# Patient Record
Sex: Female | Born: 1965 | Race: White | Hispanic: No | Smoking: Current every day smoker
Health system: Southern US, Community
[De-identification: ages and names within clinical notes are randomized; demographics above are authoritative.]

## PROBLEM LIST (undated history)

## (undated) DIAGNOSIS — N6452 Nipple discharge: Secondary | ICD-10-CM

## (undated) DIAGNOSIS — I10 Essential (primary) hypertension: Secondary | ICD-10-CM

## (undated) DIAGNOSIS — K219 Gastro-esophageal reflux disease without esophagitis: Secondary | ICD-10-CM

## (undated) DIAGNOSIS — I639 Cerebral infarction, unspecified: Secondary | ICD-10-CM

## (undated) HISTORY — DX: Nipple discharge: N64.52

## (undated) HISTORY — DX: Essential (primary) hypertension: I10

## (undated) HISTORY — PX: CHOLECYSTECTOMY: SHX55

## (undated) HISTORY — DX: Cerebral infarction, unspecified: I63.9

## (undated) HISTORY — DX: Gastro-esophageal reflux disease without esophagitis: K21.9

## (undated) HISTORY — PX: TUBAL LIGATION: SHX77

---

## 2007-08-22 ENCOUNTER — Emergency Department: Payer: Self-pay

## 2008-07-09 ENCOUNTER — Emergency Department: Payer: Self-pay | Admitting: Emergency Medicine

## 2011-02-14 ENCOUNTER — Emergency Department: Payer: Self-pay | Admitting: Emergency Medicine

## 2013-07-05 ENCOUNTER — Ambulatory Visit: Payer: Self-pay

## 2013-07-12 ENCOUNTER — Ambulatory Visit (INDEPENDENT_AMBULATORY_CARE_PROVIDER_SITE_OTHER): Payer: PRIVATE HEALTH INSURANCE | Admitting: General Surgery

## 2013-07-12 ENCOUNTER — Encounter: Payer: Self-pay | Admitting: General Surgery

## 2013-07-12 VITALS — BP 138/78 | HR 94 | Resp 12 | Ht 64.0 in | Wt 213.0 lb

## 2013-07-12 DIAGNOSIS — N6459 Other signs and symptoms in breast: Secondary | ICD-10-CM

## 2013-07-12 DIAGNOSIS — N6452 Nipple discharge: Secondary | ICD-10-CM

## 2013-07-12 HISTORY — DX: Nipple discharge: N64.52

## 2013-07-12 NOTE — Progress Notes (Signed)
Patient ID: Carolyn Murray, female   DOB: 1966-04-04, 47 y.o.   MRN: [id number]  Chief Complaint  Patient presents with  . Other    bilateral breast discharge    HPI Carolyn Murray is a 47 y.o. female who presents for a breast evaluation. The patient states she is having bilateral breast discharge for about two months now and getting worst.She states it is dark green.Patient had an mammogram and ultrasound completed 07/05/13, BI-RAD cat 2. She has never had any breast problems in the past. HPI  Past Medical History  Diagnosis Date  . Stroke   . GERD (gastroesophageal reflux disease)   . Hypertension   . Nipple discharge 07/12/2013    Past Surgical History  Procedure Laterality Date  . Cholecystectomy    . Tubal ligation      Family History  Problem Relation Age of Onset  . Skin cancer Father   . Breast cancer Maternal Grandmother   . Diabetes Mother     pass in 2013    Social History History  Substance Use Topics  . Smoking status: Current Every Day Smoker -- 0.50 packs/day for 20 years    Types: Cigarettes  . Smokeless tobacco: Never Used  . Alcohol Use: Yes    No Known Allergies  Current Outpatient Prescriptions  Medication Sig Dispense Refill  . Aspirin-Salicylamide-Caffeine (BC HEADACHE POWDER PO) Take 1 Package by mouth every 6 (six) hours as needed.      Marland Kitchen ibuprofen (ADVIL,MOTRIN) 200 MG tablet Take 200 mg by mouth every 6 (six) hours as needed for pain.       No current facility-administered medications for this visit.    Review of Systems Review of Systems  Constitutional: Negative.   Respiratory: Negative.   Cardiovascular: Negative.     Blood pressure 138/78, pulse 94, resp. rate 12, height 5\' 4"  (1.626 m), weight 213 lb (96.616 kg).  Physical Exam Physical Exam  Constitutional: She is oriented to person, place, and time. She appears well-developed and well-nourished.  Cardiovascular: Normal rate, regular rhythm and normal heart sounds.    Pulmonary/Chest: Breath sounds normal. Right breast exhibits no inverted nipple, no mass, no nipple discharge, no skin change and no tenderness. Left breast exhibits no inverted nipple, no mass, no nipple discharge, no skin change and no tenderness.  Lymphadenopathy:    She has no cervical adenopathy.    She has no axillary adenopathy.  Neurological: She is alert and oriented to person, place, and time.  Skin: Skin is warm and dry.   No nipple discharge could be elicited on today's exam in spite of vigorous compression.  Data Reviewed Recently completed mammograms and ultrasound were reviewed.  Assessment    Likely ductal inflammation.    Plan   ( Smoking cessation was encouraged. She reports that all nonsteroidals produce drowsiness. She's been asked to make use of 2 Aleve tablets at bedtime for the next month to see if this will alleviate her symptoms.       Earline Mayotte 07/12/2013, 9:30 PM

## 2013-07-12 NOTE — Patient Instructions (Signed)
Patient to take two  Aleve's at night for the next month.

## 2013-08-15 ENCOUNTER — Ambulatory Visit: Payer: PRIVATE HEALTH INSURANCE | Admitting: General Surgery

## 2013-08-30 ENCOUNTER — Other Ambulatory Visit: Payer: PRIVATE HEALTH INSURANCE

## 2013-08-30 ENCOUNTER — Ambulatory Visit (INDEPENDENT_AMBULATORY_CARE_PROVIDER_SITE_OTHER): Payer: PRIVATE HEALTH INSURANCE | Admitting: General Surgery

## 2013-08-30 ENCOUNTER — Encounter: Payer: Self-pay | Admitting: General Surgery

## 2013-08-30 VITALS — BP 164/96 | HR 104 | Resp 14 | Ht 64.0 in | Wt 209.0 lb

## 2013-08-30 DIAGNOSIS — N6452 Nipple discharge: Secondary | ICD-10-CM

## 2013-08-30 DIAGNOSIS — N6459 Other signs and symptoms in breast: Secondary | ICD-10-CM

## 2013-08-30 NOTE — Progress Notes (Signed)
2Patient ID: Carolyn Murray, female   DOB: 01-30-1966, 47 y.o.   MRN: [id number]  Chief Complaint  Patient presents with  . Follow-up    bilateral nipple discharge    HPI Carolyn Murray is a 47 y.o. female.  Here today for one month follow up bilateral nipple discharge.  States discharge is about the same no better and no worse.  States it occurs "own its own" and with warm showers..  Using aleve twice a day but breast area still tender. Overall, the pain does seem to be some better.   HPI  Past Medical History  Diagnosis Date  . Stroke   . GERD (gastroesophageal reflux disease)   . Hypertension   . Nipple discharge 07/12/2013    Past Surgical History  Procedure Laterality Date  . Cholecystectomy    . Tubal ligation      Family History  Problem Relation Age of Onset  . Skin cancer Father   . Breast cancer Maternal Grandmother   . Diabetes Mother     pass in 2013    Social History History  Substance Use Topics  . Smoking status: Current Every Day Smoker -- 0.50 packs/day for 20 years    Types: Cigarettes  . Smokeless tobacco: Never Used  . Alcohol Use: Yes    No Known Allergies  Current Outpatient Prescriptions  Medication Sig Dispense Refill  . Aspirin-Salicylamide-Caffeine (BC HEADACHE POWDER PO) Take 1 Package by mouth every 6 (six) hours as needed.      Marland Kitchen ibuprofen (ADVIL,MOTRIN) 200 MG tablet Take 200 mg by mouth every 6 (six) hours as needed for pain.       No current facility-administered medications for this visit.    Review of Systems Review of Systems  Constitutional: Negative.   Respiratory: Negative.   Cardiovascular: Negative.     Blood pressure 164/96, pulse 104, resp. rate 14, height 5\' 4"  (1.626 m), weight 209 lb (94.802 kg).  Physical Exam Physical Exam  Constitutional: She is oriented to person, place, and time. She appears well-developed and well-nourished.  Cardiovascular: Normal rate and regular rhythm.   Pulmonary/Chest: Effort normal and  breath sounds normal. Right breast exhibits nipple discharge. Right breast exhibits no inverted nipple (three ducts with tan drainage), no mass, no skin change and no tenderness. Left breast exhibits nipple discharge (single duct drainage). Left breast exhibits no inverted nipple, no mass, no skin change and no tenderness.  Lymphadenopathy:    She has no cervical adenopathy.    She has no axillary adenopathy.  Neurological: She is alert and oriented to person, place, and time.  Skin: Skin is warm and dry.    Data Reviewed Ultrasound examination was completed and the retroareolar areas bilaterally. On the left mild ductal dilatation up to 0.23 cm was appreciated. There was a hypoechoic nodule the 12:00 position, 1.7 cm from the nipple measuring up to 0.45 cm in diameter. This appeared to be in continuity with an adjacent duct. No intraductal lesions were noted.  Examination of the right breast also showed mild ductal dilatation up to 0.22 cm per no significant nodularity was appreciated. No intraductal lesions.  Assessment    Nipple drainage, no identification of underlying pathology.    Plan    The importance of smoking cessation was again emphasized. As she is noticed a significant improvement or breast pain, operative intervention is not appropriate at this time. She is aware that excision may be accompanied by delayed healing if she continues to smoke.  Carolyn Murray 09/01/2013, 8:08 AM

## 2013-08-30 NOTE — Patient Instructions (Addendum)
Continue self breast exams. Call office for any new breast issues or concerns. Encouraged her to consider stop smoking Continue aleve as needed

## 2013-09-01 ENCOUNTER — Encounter: Payer: Self-pay | Admitting: General Surgery

## 2013-09-12 ENCOUNTER — Encounter: Payer: Self-pay | Admitting: General Surgery

## 2013-10-30 ENCOUNTER — Ambulatory Visit: Payer: PRIVATE HEALTH INSURANCE | Admitting: General Surgery

## 2013-11-21 ENCOUNTER — Encounter: Payer: Self-pay | Admitting: *Deleted

## 2014-04-01 ENCOUNTER — Emergency Department: Payer: Self-pay | Admitting: Emergency Medicine

## 2014-04-01 LAB — CBC WITH DIFFERENTIAL/PLATELET
BASOS ABS: 0.2 10*3/uL — AB (ref 0.0–0.1)
BASOS PCT: 1.1 %
EOS PCT: 1.9 %
Eosinophil #: 0.4 10*3/uL (ref 0.0–0.7)
HCT: 43.5 % (ref 35.0–47.0)
HGB: 14.3 g/dL (ref 12.0–16.0)
LYMPHS ABS: 3.5 10*3/uL (ref 1.0–3.6)
LYMPHS PCT: 16.7 %
MCH: 30 pg (ref 26.0–34.0)
MCHC: 32.8 g/dL (ref 32.0–36.0)
MCV: 91 fL (ref 80–100)
MONO ABS: 0.6 x10 3/mm (ref 0.2–0.9)
Monocyte %: 2.8 %
NEUTROS PCT: 77.5 %
Neutrophil #: 16.3 10*3/uL — ABNORMAL HIGH (ref 1.4–6.5)
Platelet: 350 10*3/uL (ref 150–440)
RBC: 4.77 10*6/uL (ref 3.80–5.20)
RDW: 13.6 % (ref 11.5–14.5)
WBC: 21 10*3/uL — AB (ref 3.6–11.0)

## 2014-04-01 LAB — URINALYSIS, COMPLETE
BACTERIA: NONE SEEN
Bilirubin,UR: NEGATIVE
Glucose,UR: NEGATIVE mg/dL (ref 0–75)
KETONE: NEGATIVE
Leukocyte Esterase: NEGATIVE
NITRITE: NEGATIVE
PH: 5 (ref 4.5–8.0)
Protein: 30
RBC,UR: 1560 /HPF (ref 0–5)
Specific Gravity: 1.023 (ref 1.003–1.030)
Squamous Epithelial: 14
WBC UR: 4 /HPF (ref 0–5)

## 2014-04-01 LAB — COMPREHENSIVE METABOLIC PANEL
ALK PHOS: 121 U/L — AB
ALT: 20 U/L (ref 12–78)
ANION GAP: 5 — AB (ref 7–16)
AST: 22 U/L (ref 15–37)
Albumin: 3.5 g/dL (ref 3.4–5.0)
BUN: 14 mg/dL (ref 7–18)
Bilirubin,Total: 0.2 mg/dL (ref 0.2–1.0)
CALCIUM: 9.1 mg/dL (ref 8.5–10.1)
CO2: 26 mmol/L (ref 21–32)
Chloride: 107 mmol/L (ref 98–107)
Creatinine: 0.71 mg/dL (ref 0.60–1.30)
EGFR (African American): >60
EGFR (Non-African Amer.): >60
GLUCOSE: 126 mg/dL — AB (ref 65–99)
OSMOLALITY: 278 (ref 275–301)
POTASSIUM: 3.7 mmol/L (ref 3.5–5.1)
SODIUM: 138 mmol/L (ref 136–145)
TOTAL PROTEIN: 7.8 g/dL (ref 6.4–8.2)

## 2014-04-01 LAB — LIPASE, BLOOD: LIPASE: 137 U/L (ref 73–393)

## 2014-07-14 IMAGING — CT CT STONE STUDY
3 of 4 series · 5 of 16 positions shown, 6 images · non-contrast
Comparison: None.

CLINICAL DATA: Flank pain and hematuria

EXAM:
CT ABDOMEN AND PELVIS WITHOUT CONTRAST
TECHNIQUE: Multidetector CT imaging of the abdomen and pelvis was performed
following the standard protocol without oral or intravenous contrast
material administration.

[Series 4: lung · axial · 0.78mm/px · z∈[-186,-186]mm · 1 of 31 slices shown, 2 images]
[im 1/31  soft-tissue]
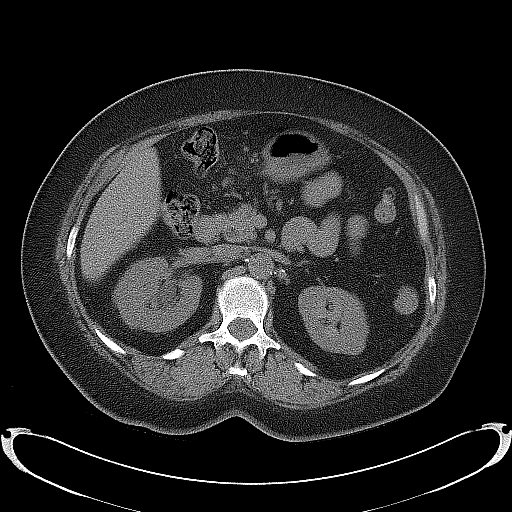
[im 1/31  bone]
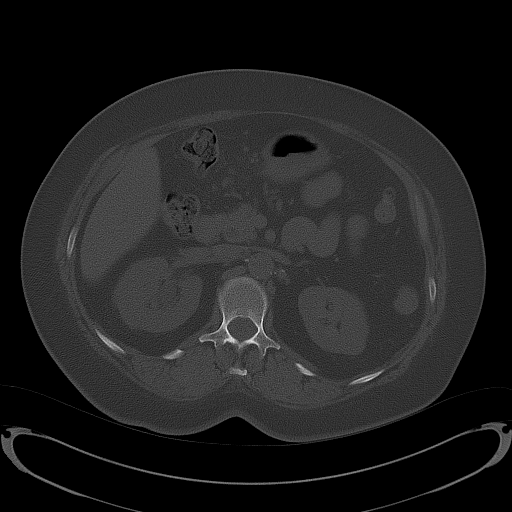

[Series 5: coronal · coronal · 0.72mm/px · 3 of 139 slices shown]
[im 35/139  soft-tissue]
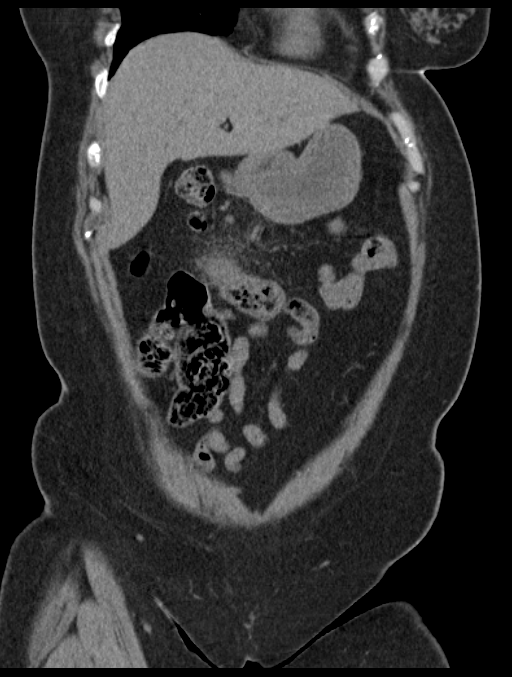
[im 70/139  soft-tissue]
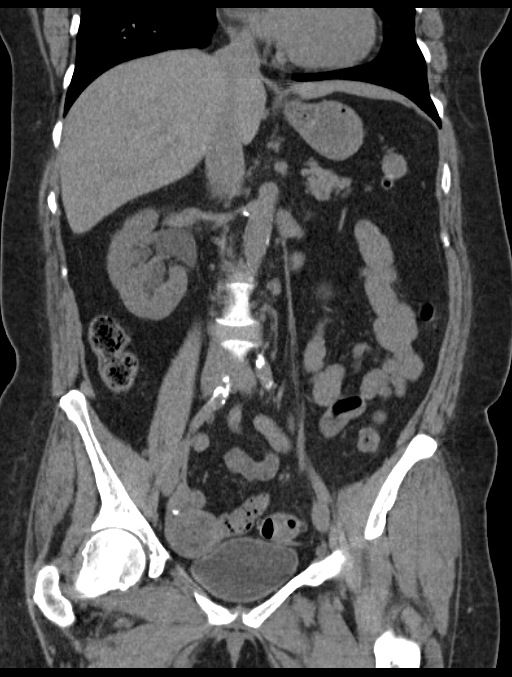
[im 104/139  soft-tissue]
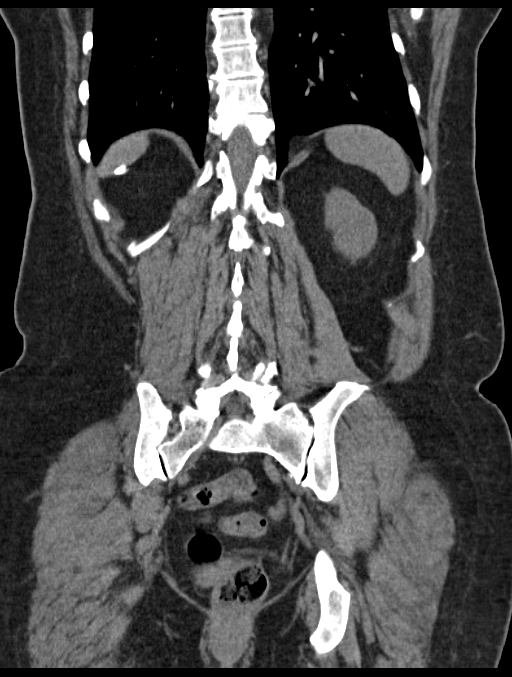

[Series 6: sagittal · sagittal · 0.53mm/px · 1 of 180 slices shown]
[im 72/180  soft-tissue]
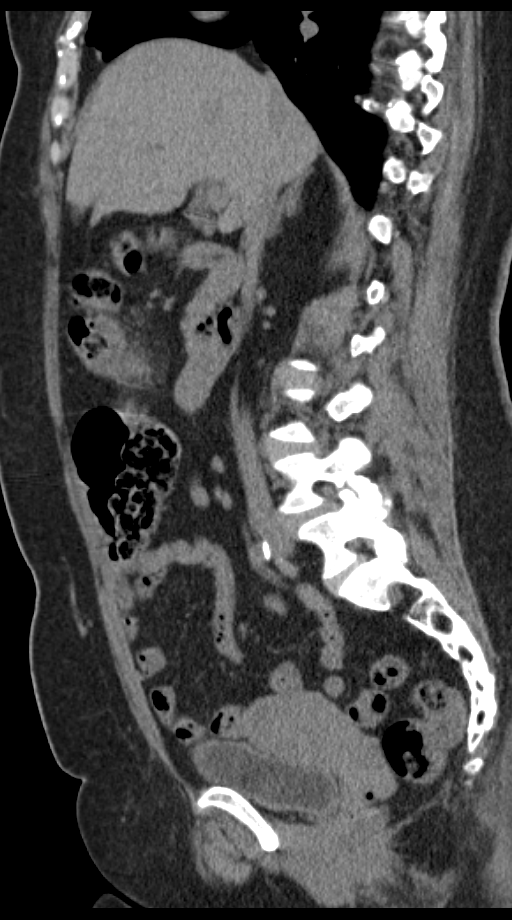

[5 of 16 positions shown; findings below may reference images not displayed]

FINDINGS: There is mild bibasilar atelectatic change. On axial slice 11 series
4, there is a 6 mm nodular opacity lateral segment of the left lower
lobe. There is thickening of the distal esophageal wall.

Liver is enlarged measuring 18.5 cm in length. No focal liver
lesions are identified on this noncontrast enhanced study.
Gallbladder is absent. There is no biliary duct dilatation.

Spleen, pancreas, and adrenals appear normal.

Left kidney shows no evidence of mass, calculus, or hydronephrosis.
There is no evidence of ureteral calculus on the left.

On the right, there is a 2 mm calculus in the mid kidney region.
There is moderate hydronephrosis on the right. There is no right
renal mass. There is a 4 mm calculus in the proximal right ureter
just beyond the ureteropelvic junction. No other ureteral calculus
is appreciated.

In the pelvis, the urinary bladder is midline with wall thickness
within normal limits. There are multiple sigmoid diverticula. There
is mild muscular hypertrophy in the sigmoid colon from
diverticulosis. No frank sigmoid diverticulitis is seen. There is no
pelvic mass or fluid collection. Appendix appears normal.

There is no bowel obstruction. There is no free air or portal venous
air.

There is mesenteric inflammation surrounding the proximal transverse
colon consistent with diverticulitis in this area. There is no
abscess in this area.

There is no ascites or abscess in the abdomen or pelvis. There is a
small ventral hernia containing only fat.

There are several lymph nodes in the right abdomen which may in the
appropriate clinical setting be indicative of mesenteric adenitis.
The largest individual lymph node in this area measures 1.5 by
cm. No lymph node prominence is seen elsewhere in the abdomen or
pelvis.

There is atherosclerotic change in the aorta but no aneurysm. There
are no blastic or lytic bone lesions. There is arthropathy in the
lumbar spine.
IMPRESSION: 4 mm calculus in the proximal right ureter causing moderate
hydronephrosis on the right. There is a small nonobstructing
calculus in the mid right kidney.

There is evidence of diverticulitis involving the proximal
transverse colon. There is no free intraperitoneal air.

Liver is enlarged.  No focal liver lesions are identified.

There is esophageal wall thickening distally. Question chronic
reflux change.

6 mm nodular opacity left lung base. Followup of this nodular
opacity should be based on [HOSPITAL] guidelines. If the
patient is at high risk for bronchogenic carcinoma, follow-up chest
CT at 6-12 months is recommended. If the patient is at low risk for
bronchogenic carcinoma, follow-up chest CT at 12 months is
recommended. This recommendation follows the consensus statement:
Guidelines for Management of Small Pulmonary Nodules Detected on CT
Scans: A Statement from the [HOSPITAL] as published in

Mildly prominent right mid to lower abdomen lymph nodes. Question a
degree of mesenteric adenitis.

Small ventral hernia containing only fat. There is sigmoid
diverticulosis without frank diverticulitis in the sigmoid region.

.

## 2014-10-29 ENCOUNTER — Encounter: Payer: Self-pay | Admitting: General Surgery

## 2015-05-19 ENCOUNTER — Encounter: Payer: Self-pay | Admitting: Emergency Medicine

## 2015-05-19 ENCOUNTER — Emergency Department
Admission: EM | Admit: 2015-05-19 | Discharge: 2015-05-19 | Disposition: A | Discharge status: Home / Self Care | Payer: Self-pay | Attending: Emergency Medicine | Admitting: Emergency Medicine

## 2015-05-19 DIAGNOSIS — Z720 Tobacco use: Secondary | ICD-10-CM | POA: Insufficient documentation

## 2015-05-19 DIAGNOSIS — Z7982 Long term (current) use of aspirin: Secondary | ICD-10-CM | POA: Insufficient documentation

## 2015-05-19 DIAGNOSIS — R220 Localized swelling, mass and lump, head: Secondary | ICD-10-CM | POA: Insufficient documentation

## 2015-05-19 DIAGNOSIS — K0381 Cracked tooth: Secondary | ICD-10-CM | POA: Insufficient documentation

## 2015-05-19 DIAGNOSIS — K047 Periapical abscess without sinus: Secondary | ICD-10-CM | POA: Insufficient documentation

## 2015-05-19 DIAGNOSIS — I10 Essential (primary) hypertension: Secondary | ICD-10-CM | POA: Insufficient documentation

## 2015-05-19 MED ORDER — AMOXICILLIN 500 MG PO TABS: 500.0000 mg | ORAL_TABLET | Freq: Two times a day (BID) | ORAL | Status: DC

## 2015-05-19 MED ORDER — TRAMADOL HCL 50 MG PO TABS: 50.0000 mg | ORAL_TABLET | Freq: Four times a day (QID) | ORAL | Status: DC | PRN

## 2015-05-19 NOTE — ED Notes (Signed)
Pt reports pain to back of neck and swelling to right side of face since yesterday. Denies any throat or ear pain. Denies any difficulty breathing or swallowing.

## 2015-05-19 NOTE — ED Provider Notes (Signed)
CSN: [id number]     Arrival date & time 05/19/15  1503 History   None    Chief Complaint  Patient presents with  . Facial Swelling     HPI: Patient presents to emergency department for a 2 day history of pain in the right lower jaw and neck. She denies history of the same. She states that she has a tooth that is broken and the back but it's been that way for 3 years and has never caused any problems. Pain is worsened when she tries to eat. She denies fever. She denies sore throat or difficulty swallowing.  Past Medical History  Diagnosis Date  . Stroke   . GERD (gastroesophageal reflux disease)   . Hypertension   . Nipple discharge 07/12/2013   Past Surgical History  Procedure Laterality Date  . Cholecystectomy    . Tubal ligation     Family History  Problem Relation Age of Onset  . Skin cancer Father   . Breast cancer Maternal Grandmother   . Diabetes Mother     pass in 2013   History  Substance Use Topics  . Smoking status: Current Every Day Smoker -- 0.50 packs/day for 20 years    Types: Cigarettes  . Smokeless tobacco: Never Used  . Alcohol Use: No   OB History    Gravida Para Term Preterm AB TAB SAB Ectopic Multiple Living   Obstetric Comments   Menstrual age: 51  Age 1st Pregnancy: 20     Review of Systems  Constitutional: Negative for fever and chills.  HENT: Positive for dental problem. Negative for ear pain, sinus pressure, sore throat, trouble swallowing and voice change.   Respiratory: Negative for shortness of breath.   Cardiovascular: Negative for chest pain.  Gastrointestinal: Negative for nausea.  Allergic/Immunologic: Negative for immunocompromised state.  Neurological: Negative for facial asymmetry and speech difficulty.      Allergies  Review of patient's allergies indicates no known allergies.  Home Medications   Prior to Admission medications   Medication Sig Start Date End Date Taking? Authorizing Provider    amoxicillin (AMOXIL) 500 MG tablet Take 1 tablet (500 mg total) by mouth 2 (two) times daily. 05/19/15   Chinita Pester, FNP  Aspirin-Salicylamide-Caffeine (BC HEADACHE POWDER PO) Take 1 Package by mouth every 6 (six) hours as needed.    Historical Provider, MD  ibuprofen (ADVIL,MOTRIN) 200 MG tablet Take 200 mg by mouth every 6 (six) hours as needed for pain.    Historical Provider, MD  traMADol (ULTRAM) 50 MG tablet Take 1 tablet (50 mg total) by mouth every 6 (six) hours as needed. 05/19/15   Ryane Canavan B Adisson Deak, FNP   BP 173/83 mmHg  Pulse 82  Temp(Src) 98.2 F (36.8 C) (Oral)  Resp 20  Ht  (1.626 m)  Wt 195 lb (88.451 kg)  BMI 33.46 kg/m2  SpO2 100% Physical Exam  Constitutional: She is oriented to person, place, and time. She appears well-developed and well-nourished.  HENT:  Head: Normocephalic.  Right Ear: External ear normal.  Left Ear: External ear normal.  Mouth/Throat: Oropharynx is clear and moist. Mucous membranes are not dry. No oropharyngeal exudate.    Neck: Normal range of motion. Neck supple. No tracheal deviation present.  Pulmonary/Chest: No respiratory distress.  Musculoskeletal: Normal range of motion.  Lymphadenopathy:    She has no cervical adenopathy.  Neurological: She is alert  and oriented to person, place, and time.  Skin: Skin is warm and dry.  Psychiatric: She has a normal mood and affect. Her behavior is normal.    ED Course  Procedures (including critical care time) Labs Review Labs Reviewed - No data to display  Imaging Review No results found.   EKG Interpretation None     Patient was advised to follow-up with the dentist within 10 days. Advised to return to the emergency department for symptoms that change or worsen if she unable schedule an appointment.  MDM   Final diagnoses:  Dental abscess       Chinita PesterCari B Zyrah Wiswell, FNP 05/19/15 1558  Minna AntisKevin Paduchowski, MD 05/19/15 [phone]

## 2015-05-19 NOTE — Discharge Instructions (Signed)
Dental Abscess °A dental abscess is a collection of infected fluid (pus) from a bacterial infection in the inner part of the tooth (pulp). It usually occurs at the end of the tooth's root.  °CAUSES  °· Severe tooth decay. °· Trauma to the tooth that allows bacteria to enter into the pulp, such as a broken or chipped tooth. °SYMPTOMS  °· Severe pain in and around the infected tooth. °· Swelling and redness around the abscessed tooth or in the mouth or face. °· Tenderness. °· Pus drainage. °· Bad breath. °· Bitter taste in the mouth. °· Difficulty swallowing. °· Difficulty opening the mouth. °· Nausea. °· Vomiting. °· Chills. °· Swollen neck glands. °DIAGNOSIS  °· A medical and dental history will be taken. °· An examination will be performed by tapping on the abscessed tooth. °· X-rays may be taken of the tooth to identify the abscess. °TREATMENT °The goal of treatment is to eliminate the infection. You may be prescribed antibiotic medicine to stop the infection from spreading. A root canal may be performed to save the tooth. If the tooth cannot be saved, it may be pulled (extracted) and the abscess may be drained.  °HOME CARE INSTRUCTIONS °· Only take over-the-counter or prescription medicines for pain, fever, or discomfort as directed by your caregiver. °· Rinse your mouth (gargle) often with salt water (¼ tsp salt in 8 oz [250 ml] of warm water) to relieve pain or swelling. °· Do not drive after taking pain medicine (narcotics). °· Do not apply heat to the outside of your face. °· Return to your dentist for further treatment as directed. °SEEK MEDICAL CARE IF: °· Your pain is not helped by medicine. °· Your pain is getting worse instead of better. °SEEK IMMEDIATE MEDICAL CARE IF: °· You have a fever or persistent symptoms for more than 2-3 days. °· You have a fever and your symptoms suddenly get worse. °· You have chills or a very bad headache. °· You have problems breathing or swallowing. °· You have trouble  opening your mouth. °· You have swelling in the neck or around the eye. °Document Released: 12/14/2005 Document Revised: 09/07/2012 Document Reviewed: 03/24/2011 °ExitCare® Patient Information ©2015 [employer]. This information is not intended to replace advice given to you by your health care provider. Make sure you discuss any questions you have with your health care provider. ° ° °OPTIONS FOR DENTAL FOLLOW UP CARE ° °[place] [facility] - Local Safety Net Dental Clinics °http://www.ncdhhs.gov/dph/oralhealth/services/safetynetclinics.htm °  °[facility] (336-562-3123) ° °[facility] (919-933-9087) ° °[facility] (919-663-1744 ext 237) ° °[facility] (336-570-6415) ° °[facility] (919-968-2025) °This clinic caters to the indigent population and is on a lottery system. °Location: °[facility], [address], [place] °Clinic Hours: °Wednesdays from 6pm - 9pm, patients seen by a lottery system. °For dates, call or go to www.med.[facility].edu/[facility]/patients/Dental-[facility] °Services: °Cleanings, fillings and simple extractions. °Payment Options: °DENTAL WORK IS FREE OF CHARGE. Bring proof of income or support. °Best way to get seen: °Arrive at 5:15 pm - this is a lottery, NOT first come/first serve, so arriving earlier will not increase your chances of being seen. °  °  °[facility] °919-537-3737 °Select option 1 for emergencies °  °Location: °[facility], [address], [place] °Clinic Hours: °No walk-ins accepted - call the day before to schedule an appointment. °Check in times   are 9:30 am and 1:30 pm. °Services: °Simple extractions, temporary fillings, pulpectomy/pulp debridement, uncomplicated abscess drainage. °Payment Options: °PAYMENT IS DUE AT THE TIME OF SERVICE.  Fee is usually $100-200, additional surgical procedures (e.g. abscess drainage) may  be extra. °Cash, checks, Visa/MasterCard accepted.  Can file Medicaid if patient is covered for dental - patient should call case worker to check. °No discount for [facility] patients. °Best way to get seen: °MUST call the day before and get onto the schedule. Can usually be seen the next 1-2 days. No walk-ins accepted. °  °  °[facility] °919-933-9087 °  °Location: °[facility], [address], [place] °Clinic Hours: °M, W, Th, F 8am or 1:30pm, Tues 9a or 1:30 - first come/first served. °Services: °Simple extractions, temporary fillings, uncomplicated abscess drainage.  You do not need to be an [place] resident. °Payment Options: °PAYMENT IS DUE AT THE TIME OF SERVICE. °Dental insurance, otherwise sliding scale - bring proof of income or support. °Depending on income and treatment needed, cost is usually $50-200. °Best way to get seen: °Arrive early as it is first come/first served. °  °  °[facility] °[phone] °  °Location: °[address] °Clinic Hours: °Mon-Thu 8a-5p °Services: °Most basic dental services including extractions and fillings. °Payment Options: °PAYMENT IS DUE AT THE TIME OF SERVICE. °Sliding scale, up to 50% off - bring proof if income or support. °Medicaid with dental option accepted. °Best way to get seen: °Call to schedule an appointment, can usually be seen within 2 weeks OR they will try to see walk-ins - show up at 8a or 2p (you may have to wait). °  °  °[facility] °919-245-2435 °[place] RESIDENTS ONLY °  °Location: °[facility], [address], [place], [place] [address] °Clinic Hours: By appointment only. °Monday - Thursday 8am-5pm, Friday 8am-12pm °Services: Cleanings, fillings, extractions. °Payment Options: °PAYMENT IS DUE AT THE TIME OF SERVICE. °Cash, Visa or MasterCard. Sliding scale - $30 minimum per service. °Best way to get seen: °Come in to  office, complete packet and make an appointment - need proof of income °or support monies for each household member and proof of [place] residence. °Usually takes about a month to get in. °  °  °[facility] °[phone] °  °Location: °[address] [place] °Clinic Hours: Walk-in Urgent Care Dental Services are offered Monday-Friday mornings only. °The numbers of emergencies accepted daily is limited to the number of °providers available. °Maximum 15 - Mondays, Wednesdays & Thursdays °Maximum 10 - Tuesdays & Fridays °Services: °You do not need to be a [place] resident to be seen for a dental emergency. °Emergencies are defined as pain, swelling, abnormal bleeding, or dental trauma. Walkins will receive x-rays if needed. °NOTE: Dental cleaning is not an emergency. °Payment Options: °PAYMENT IS DUE AT THE TIME OF SERVICE. °Minimum co-pay is $40.00 for uninsured patients. °Minimum co-pay is $3.00 for Medicaid with dental coverage. °Dental Insurance is accepted and must be presented at time of visit. °Medicare does not cover dental. °Forms of payment: Cash, credit card, checks. °Best way to get seen: °If not previously registered with the clinic, walk-in dental registration begins at 7:15 am and is on a first come/first serve basis. °If previously registered with the clinic, call to make an appointment. °  °  °[facility] °919-776-4359 °[place] RESIDENTS ONLY °  °Location: °[address],   [place], [place] °Clinic Hours: °Mon-Thu 10a-2p °Services: Extractions only! °Payment Options: °FREE (donations accepted) - bring proof of income or support °Best way to get seen: °Call and schedule an appointment OR come at 8am on the 1st Monday of every month (except for holidays) when it is first come/first served. °  °  °[facility] °[phone] °  °Location: °[address], [place] °Clinic Hours: °Friday mornings °Services, Payment Options, Best way to get  seen: °Call for info °

## 2016-06-04 DIAGNOSIS — F17210 Nicotine dependence, cigarettes, uncomplicated: Secondary | ICD-10-CM | POA: Insufficient documentation

## 2016-06-04 DIAGNOSIS — N200 Calculus of kidney: Secondary | ICD-10-CM | POA: Insufficient documentation

## 2016-06-04 DIAGNOSIS — Z79899 Other long term (current) drug therapy: Secondary | ICD-10-CM | POA: Insufficient documentation

## 2016-06-04 DIAGNOSIS — Z8673 Personal history of transient ischemic attack (TIA), and cerebral infarction without residual deficits: Secondary | ICD-10-CM | POA: Insufficient documentation

## 2016-06-04 DIAGNOSIS — I10 Essential (primary) hypertension: Secondary | ICD-10-CM | POA: Insufficient documentation

## 2016-06-04 DIAGNOSIS — Z7982 Long term (current) use of aspirin: Secondary | ICD-10-CM | POA: Insufficient documentation

## 2016-06-04 DIAGNOSIS — K5732 Diverticulitis of large intestine without perforation or abscess without bleeding: Secondary | ICD-10-CM | POA: Insufficient documentation

## 2016-06-04 NOTE — ED Notes (Addendum)
Pt in with co right lower back pain and having urinary frequency with dark urine. Pt denies any abd pain or fever.

## 2016-06-05 ENCOUNTER — Emergency Department: Payer: Self-pay

## 2016-06-05 ENCOUNTER — Emergency Department
Admission: EM | Admit: 2016-06-05 | Discharge: 2016-06-05 | Disposition: A | Discharge status: Home / Self Care | Payer: Self-pay | Attending: Emergency Medicine | Admitting: Emergency Medicine

## 2016-06-05 DIAGNOSIS — K5732 Diverticulitis of large intestine without perforation or abscess without bleeding: Secondary | ICD-10-CM

## 2016-06-05 DIAGNOSIS — N200 Calculus of kidney: Secondary | ICD-10-CM

## 2016-06-05 DIAGNOSIS — R109 Unspecified abdominal pain: Secondary | ICD-10-CM

## 2016-06-05 LAB — URINALYSIS COMPLETE WITH MICROSCOPIC (ARMC ONLY)
BACTERIA UA: NONE SEEN
Bilirubin Urine: NEGATIVE
Glucose, UA: NEGATIVE mg/dL
Ketones, ur: NEGATIVE mg/dL
LEUKOCYTES UA: NEGATIVE
Nitrite: NEGATIVE
Protein, ur: 30 mg/dL — AB
Specific Gravity, Urine: 1.015 (ref 1.005–1.030)
pH: 5 (ref 5.0–8.0)

## 2016-06-05 LAB — CBC
HCT: 43.9 % (ref 35.0–47.0)
Hemoglobin: 14.9 g/dL (ref 12.0–16.0)
MCH: 30.4 pg (ref 26.0–34.0)
MCHC: 33.9 g/dL (ref 32.0–36.0)
MCV: 89.9 fL (ref 80.0–100.0)
PLATELETS: 341 10*3/uL (ref 150–440)
RBC: 4.88 MIL/uL (ref 3.80–5.20)
RDW: 13.5 % (ref 11.5–14.5)
WBC: 17.1 10*3/uL — AB (ref 3.6–11.0)

## 2016-06-05 LAB — BASIC METABOLIC PANEL
ANION GAP: 10 (ref 5–15)
BUN: 19 mg/dL (ref 6–20)
CO2: 22 mmol/L (ref 22–32)
Calcium: 9.5 mg/dL (ref 8.9–10.3)
Chloride: 108 mmol/L (ref 101–111)
Creatinine, Ser: 0.52 mg/dL (ref 0.44–1.00)
GFR calc Af Amer: >60 mL/min (ref >60)
GFR calc non Af Amer: >60 mL/min (ref >60)
Glucose, Bld: 99 mg/dL (ref 65–99)
POTASSIUM: 3.7 mmol/L (ref 3.5–5.1)
Sodium: 140 mmol/L (ref 135–145)

## 2016-06-05 MED ORDER — METRONIDAZOLE 500 MG PO TABS
500.0000 mg | ORAL_TABLET | Freq: Once | ORAL | Status: AC
  Administered 2016-06-05: 500 mg via ORAL
  Filled 2016-06-05: qty 1

## 2016-06-05 MED ORDER — OXYCODONE-ACETAMINOPHEN 5-325 MG PO TABS: 1.0000 | ORAL_TABLET | Freq: Four times a day (QID) | ORAL | Status: AC | PRN

## 2016-06-05 MED ORDER — METRONIDAZOLE IN NACL 5-0.79 MG/ML-% IV SOLN
500.0000 mg | Freq: Once | INTRAVENOUS | Status: DC
  Filled 2016-06-05: qty 100

## 2016-06-05 MED ORDER — TAMSULOSIN HCL 0.4 MG PO CAPS: 0.4000 mg | ORAL_CAPSULE | Freq: Every day | ORAL | Status: AC

## 2016-06-05 MED ORDER — CIPROFLOXACIN HCL 500 MG PO TABS: 500.0000 mg | ORAL_TABLET | Freq: Two times a day (BID) | ORAL | Status: AC

## 2016-06-05 MED ORDER — CIPROFLOXACIN IN D5W 400 MG/200ML IV SOLN
400.0000 mg | Freq: Once | INTRAVENOUS | Status: DC
  Filled 2016-06-05: qty 200

## 2016-06-05 MED ORDER — SODIUM CHLORIDE 0.9 % IV BOLUS (SEPSIS)
1000.0000 mL | Freq: Once | INTRAVENOUS | Status: AC
  Administered 2016-06-05: 1000 mL via INTRAVENOUS

## 2016-06-05 MED ORDER — METRONIDAZOLE 500 MG PO TABS: 500.0000 mg | ORAL_TABLET | Freq: Two times a day (BID) | ORAL | Status: AC

## 2016-06-05 MED ORDER — ONDANSETRON HCL 4 MG/2ML IJ SOLN
4.0000 mg | Freq: Once | INTRAMUSCULAR | Status: AC
  Administered 2016-06-05: 4 mg via INTRAVENOUS
  Filled 2016-06-05: qty 2

## 2016-06-05 MED ORDER — MORPHINE SULFATE (PF) 4 MG/ML IV SOLN
4.0000 mg | Freq: Once | INTRAVENOUS | Status: AC
  Administered 2016-06-05: 4 mg via INTRAVENOUS
  Filled 2016-06-05: qty 1

## 2016-06-05 MED ORDER — CIPROFLOXACIN HCL 500 MG PO TABS
500.0000 mg | ORAL_TABLET | Freq: Once | ORAL | Status: AC
  Administered 2016-06-05: 500 mg via ORAL
  Filled 2016-06-05: qty 1

## 2016-06-05 MED ORDER — ONDANSETRON 4 MG PO TBDP: 4.0000 mg | ORAL_TABLET | Freq: Three times a day (TID) | ORAL | Status: AC | PRN

## 2016-06-05 NOTE — ED Notes (Addendum)
Pt already dressed in clothes, stating if she "does not leave in 20 minutes I will not have a ride." Daughter waiting out in parking lot for pt.

## 2016-06-05 NOTE — ED Notes (Signed)
Pt states R sided kidney pain and bloody urine. Pt states hx of kidney stones. Pt denies pain with urination, N&V.

## 2016-06-05 NOTE — ED Provider Notes (Signed)
[facility] Emergency Department Provider Note   ____________________________________________  Time seen: Approximately 341 AM  I have reviewed the triage vital signs and the nursing notes.   HISTORY  Chief Complaint Flank Pain    HPI Carolyn Murray is a 50 y.o. female who comes into the hospital today with kidney pain. The patient reports she is also been urinating blood. The patient reports that she had more symptoms approximately a year ago and thinks she had a kidney stone. The patient reports that she has been hurting since Thursday. The patient is taking ibuprofen but it has not been helping. She also been taking BC powders. The patient rates her pain a 7 out of 10 in intensity. The patient had no nausea, no vomiting and no pain with urination. The patient reports that the pain startedapproximately one week ago. She is here for evaluation.   Past Medical History  Diagnosis Date  . Stroke   . GERD (gastroesophageal reflux disease)   . Hypertension   . Nipple discharge 07/12/2013    Patient Active Problem List   Diagnosis Date Noted  . Nipple discharge 07/12/2013    Past Surgical History  Procedure Laterality Date  . Cholecystectomy    . Tubal ligation      Current Outpatient Rx  Name  Route  Sig  Dispense  Refill  . Aspirin-Salicylamide-Caffeine (BC HEADACHE POWDER PO)   Oral   Take 1 Package by mouth every 6 (six) hours as needed.         . Fish Oil-Cholecalciferol (FISH OIL + D3 PO)   Oral   Take 1 capsule by mouth daily.         Marland Kitchen. ibuprofen (ADVIL,MOTRIN) 200 MG tablet   Oral   Take 200 mg by mouth every 6 (six) hours as needed for pain.         . Multiple Vitamins-Minerals (MULTIVITAMIN WITH MINERALS) tablet   Oral   Take 1 tablet by mouth daily.         . ciprofloxacin (CIPRO) 500 MG tablet   Oral   Take 1 tablet (500 mg total) by mouth 2 (two) times daily.   20 tablet   0   . metroNIDAZOLE (FLAGYL) 500 MG  tablet   Oral   Take 1 tablet (500 mg total) by mouth 2 (two) times daily.   14 tablet   0   . ondansetron (ZOFRAN ODT) 4 MG disintegrating tablet   Oral   Take 1 tablet (4 mg total) by mouth every 8 (eight) hours as needed for nausea or vomiting.   20 tablet   0   . oxyCODONE-acetaminophen (ROXICET) 5-325 MG tablet   Oral   Take 1 tablet by mouth every 6 (six) hours as needed.   12 tablet   0   . tamsulosin (FLOMAX) 0.4 MG CAPS capsule   Oral   Take 1 capsule (0.4 mg total) by mouth daily.   7 capsule   0     Allergies Review of patient's allergies indicates no known allergies.  Family History  Problem Relation Age of Onset  . Skin cancer Father   . Breast cancer Maternal Grandmother   . Diabetes Mother     pass in 2013    Social History Social History  Substance Use Topics  . Smoking status: Current Every Day Smoker -- 0.50 packs/day for 20 years    Types: Cigarettes  . Smokeless tobacco: Never Used  . Alcohol  Use: No    Review of Systems Constitutional: No fever/chills Eyes: No visual changes. ENT: No sore throat. Cardiovascular: Denies chest pain. Respiratory: Denies shortness of breath. Gastrointestinal: No abdominal pain.  No nausea, no vomiting.  No diarrhea.  No constipation. Genitourinary: hematuria Musculoskeletal: right flank pain Skin: Negative for rash. Neurological: Negative for headaches, focal weakness or numbness.  10-point ROS otherwise negative.  ____________________________________________   PHYSICAL EXAM:  VITAL SIGNS: ED Triage Vitals  Enc Vitals Group     BP 06/04/16 2345 173/93 mmHg     Pulse Rate 06/04/16 2345 88     Resp 06/04/16 2345 18     Temp 06/04/16 2345 98.3 F (36.8 C)     Temp Source 06/04/16 2345 Oral     SpO2 06/04/16 2345 99 %     Weight 06/04/16 2345 195 lb (88.451 kg)     Height 06/04/16 2345 5\' 3"  (1.6 m)     Head Cir --      Peak Flow --      Pain Score 06/04/16 2346 7     Pain Loc --      Pain  Edu? --      Excl. in GC? --     Constitutional: Alert and oriented. Well appearing and in mild distress. Eyes: Conjunctivae are normal. PERRL. EOMI. Head: Atraumatic. Nose: No congestion/rhinnorhea. Mouth/Throat: Mucous membranes are moist.  Oropharynx non-erythematous. Cardiovascular: Normal rate, regular rhythm. Grossly normal heart sounds.  Good peripheral circulation. Respiratory: Normal respiratory effort.  No retractions. Lungs CTAB. Gastrointestinal: Soft and nontender. No distention. Positive bowels sounds, right CVA tenderness to palpation Musculoskeletal: No lower extremity tenderness nor edema.   Neurologic:  Normal speech and language.  Skin:  Skin is warm, dry and intact.  Psychiatric: Mood and affect are normal.   ____________________________________________   LABS (all labs ordered are listed, but only abnormal results are displayed)  Labs Reviewed  URINALYSIS COMPLETEWITH MICROSCOPIC ([facility] ONLY) - Abnormal; Notable for the following:    Color, Urine YELLOW (*)    APPearance HAZY (*)    Hgb urine dipstick 3+ (*)    Protein, ur 30 (*)    Squamous Epithelial / LPF 0-5 (*)    All other components within normal limits  CBC - Abnormal; Notable for the following:    WBC 17.1 (*)    All other components within normal limits  BASIC METABOLIC PANEL   ____________________________________________  EKG  none ____________________________________________  RADIOLOGY  CT renal stone study: Obstructing 4mm right UPJ calculus, mild and uncomplicated descending diverticulitis, newly dilated appendix without acute inflammation. Neighboring ileocolic adenopathy is stable.  ____________________________________________   PROCEDURES  Procedure(s) performed: None  Critical Care performed: No  ____________________________________________   INITIAL IMPRESSION / ASSESSMENT AND PLAN / ED COURSE  Pertinent labs & imaging results that were available during my care of the  patient were reviewed by me and considered in my medical decision making (see chart for details).  This is a 50 year old female who comes into the hospital today with right-sided flank pain. The patient is also having hematuria. It appears as though the patient has an obstructing right UPJ calculus. The patient did have a white blood cell count of 17 although she does not have any infection in her urine. The CT scan shows a possible uncomplicated diverticulitis. The patient is not having pain on the left at this time but reports that she's had twinges here and they're at different points in time. The patient also  has a dilated appendix but it is 9 mm and does not show any acute inflammation. The study is limited by the fact that it is a noncontrasted study. I will give the patient a dose of ciprofloxacin and Flagyl and I will discharge her home to follow-up with her primary care physician. She should also follow up with the urologist. If the patient's pain gets worse or she has any fevers or vomiting to return to emergency department immediately for further evaluation and IV antibiotics. ____________________________________________   FINAL CLINICAL IMPRESSION(S) / ED DIAGNOSES  Final diagnoses:  Right flank pain  Right kidney stone  Diverticulitis of large intestine without perforation or abscess without bleeding      NEW MEDICATIONS STARTED DURING THIS VISIT:  Discharge Medication List as of 06/05/2016  6:27 AM    START taking these medications   Details  ciprofloxacin (CIPRO) 500 MG tablet Take 1 tablet (500 mg total) by mouth 2 (two) times daily., Starting 06/05/2016, Until Mon 06/15/16, Print    metroNIDAZOLE (FLAGYL) 500 MG tablet Take 1 tablet (500 mg total) by mouth 2 (two) times daily., Starting 06/05/2016, Until Fri 06/12/16, Print    ondansetron (ZOFRAN ODT) 4 MG disintegrating tablet Take 1 tablet (4 mg total) by mouth every 8 (eight) hours as needed for nausea or vomiting., Starting  06/05/2016, Until Discontinued, Print    oxyCODONE-acetaminophen (ROXICET) 5-325 MG tablet Take 1 tablet by mouth every 6 (six) hours as needed., Starting 06/05/2016, Until Discontinued, Print    tamsulosin (FLOMAX) 0.4 MG CAPS capsule Take 1 capsule (0.4 mg total) by mouth daily., Starting 06/05/2016, Until Discontinued, Print         Note:  This document was prepared using Dragon voice recognition software and may include unintentional dictation errors.    Rebecka Apley, MD 06/05/16 [phone]

## 2016-06-05 NOTE — Discharge Instructions (Signed)
Diverticulitis Diverticulitis is inflammation or infection of small pouches in your colon that form when you have a condition called diverticulosis. The pouches in your colon are called diverticula. Your colon, or large intestine, is where water is absorbed and stool is formed. Complications of diverticulitis can include:  Bleeding.  Severe infection.  Severe pain.  Perforation of your colon.  Obstruction of your colon. CAUSES  Diverticulitis is caused by bacteria. Diverticulitis happens when stool becomes trapped in diverticula. This allows bacteria to grow in the diverticula, which can lead to inflammation and infection. RISK FACTORS People with diverticulosis are at risk for diverticulitis. Eating a diet that does not include enough fiber from fruits and vegetables may make diverticulitis more likely to develop. SYMPTOMS  Symptoms of diverticulitis may include:  Abdominal pain and tenderness. The pain is normally located on the left side of the abdomen, but may occur in other areas.  Fever and chills.  Bloating.  Cramping.  Nausea.  Vomiting.  Constipation.  Diarrhea.  Blood in your stool. DIAGNOSIS  Your health care provider will ask you about your medical history and do a physical exam. You may need to have tests done because many medical conditions can cause the same symptoms as diverticulitis. Tests may include:  Blood tests.  Urine tests.  Imaging tests of the abdomen, including X-rays and CT scans. When your condition is under control, your health care provider may recommend that you have a colonoscopy. A colonoscopy can show how severe your diverticula are and whether something else is causing your symptoms. TREATMENT  Most cases of diverticulitis are mild and can be treated at home. Treatment may include:  Taking over-the-counter pain medicines.  Following a clear liquid diet.  Taking antibiotic medicines by mouth for 7-10 days. More severe cases may  be treated at a hospital. Treatment may include:  Not eating or drinking.  Taking prescription pain medicine.  Receiving antibiotic medicines through an IV tube.  Receiving fluids and nutrition through an IV tube.  Surgery. HOME CARE INSTRUCTIONS   Follow your health care provider's instructions carefully.  Follow a full liquid diet or other diet as directed by your health care provider. After your symptoms improve, your health care provider may tell you to change your diet. He or she may recommend you eat a high-fiber diet. Fruits and vegetables are good sources of fiber. Fiber makes it easier to pass stool.  Take fiber supplements or probiotics as directed by your health care provider.  Only take medicines as directed by your health care provider.  Keep all your follow-up appointments. SEEK MEDICAL CARE IF:   Your pain does not improve.  You have a hard time eating food.  Your bowel movements do not return to normal. SEEK IMMEDIATE MEDICAL CARE IF:   Your pain becomes worse.  Your symptoms do not get better.  Your symptoms suddenly get worse.  You have a fever.  You have repeated vomiting.  You have bloody or black, tarry stools. MAKE SURE YOU:   Understand these instructions.  Will watch your condition.  Will get help right away if you are not doing well or get worse.   This information is not intended to replace advice given to you by your health care provider. Make sure you discuss any questions you have with your health care provider.   Document Released: 09/23/2005 Document Revised: 12/19/2013 Document Reviewed: 11/08/2013 Elsevier Interactive Patient Education 2016 [employer]  Flank Pain Flank pain refers to pain that  is located on the side of the body between the upper abdomen and the back. The pain may occur over a short period of time (acute) or may be long-term or reoccurring (chronic). It may be mild or severe. Flank pain can be caused by many  things. CAUSES  Some of the more common causes of flank pain include:  Muscle strains.   Muscle spasms.   A disease of your spine (vertebral disk disease).   A lung infection (pneumonia).   Fluid around your lungs (pulmonary edema).   A kidney infection.   Kidney stones.   A very painful skin rash caused by the chickenpox virus (shingles).   Gallbladder disease.  HOME CARE INSTRUCTIONS  Home care will depend on the cause of your pain. In general,  Rest as directed by your caregiver.  Drink enough fluids to keep your urine clear or pale yellow.  Only take over-the-counter or prescription medicines as directed by your caregiver. Some medicines may help relieve the pain.  Tell your caregiver about any changes in your pain.  Follow up with your caregiver as directed. SEEK IMMEDIATE MEDICAL CARE IF:   Your pain is not controlled with medicine.   You have new or worsening symptoms.  Your pain increases.   You have abdominal pain.   You have shortness of breath.   You have persistent nausea or vomiting.   You have swelling in your abdomen.   You feel faint or pass out.   You have blood in your urine.  You have a fever or persistent symptoms for more than 2-3 days.  You have a fever and your symptoms suddenly get worse. MAKE SURE YOU:   Understand these instructions.  Will watch your condition.  Will get help right away if you are not doing well or get worse.   This information is not intended to replace advice given to you by your health care provider. Make sure you discuss any questions you have with your health care provider.   Document Released: 02/04/2006 Document Revised: 09/07/2012 Document Reviewed: 07/28/2012 Elsevier Interactive Patient Education 2016 [employer]  Kidney Stones Kidney stones (urolithiasis) are deposits that form inside your kidneys. The intense pain is caused by the stone moving through the urinary tract. When  the stone moves, the ureter goes into spasm around the stone. The stone is usually passed in the urine.  CAUSES   A disorder that makes certain neck glands produce too much parathyroid hormone (primary hyperparathyroidism).  A buildup of uric acid crystals, similar to gout in your joints.  Narrowing (stricture) of the ureter.  A kidney obstruction present at birth (congenital obstruction).  Previous surgery on the kidney or ureters.  Numerous kidney infections. SYMPTOMS   Feeling sick to your stomach (nauseous).  Throwing up (vomiting).  Blood in the urine (hematuria).  Pain that usually spreads (radiates) to the groin.  Frequency or urgency of urination. DIAGNOSIS   Taking a history and physical exam.  Blood or urine tests.  CT scan.  Occasionally, an examination of the inside of the urinary bladder (cystoscopy) is performed. TREATMENT   Observation.  Increasing your fluid intake.  Extracorporeal shock wave lithotripsy--This is a noninvasive procedure that uses shock waves to break up kidney stones.  Surgery may be needed if you have severe pain or persistent obstruction. There are various surgical procedures. Most of the procedures are performed with the use of small instruments. Only small incisions are needed to accommodate these instruments, so recovery  time is minimized. The size, location, and chemical composition are all important variables that will determine the proper choice of action for you. Talk to your health care provider to better understand your situation so that you will minimize the risk of injury to yourself and your kidney.  HOME CARE INSTRUCTIONS   Drink enough water and fluids to keep your urine clear or pale yellow. This will help you to pass the stone or stone fragments.  Strain all urine through the provided strainer. Keep all particulate matter and stones for your health care provider to see. The stone causing the pain may be as small as a  grain of salt. It is very important to use the strainer each and every time you pass your urine. The collection of your stone will allow your health care provider to analyze it and verify that a stone has actually passed. The stone analysis will often identify what you can do to reduce the incidence of recurrences.  Only take over-the-counter or prescription medicines for pain, discomfort, or fever as directed by your health care provider.  Keep all follow-up visits as told by your health care provider. This is important.  Get follow-up X-rays if required. The absence of pain does not always mean that the stone has passed. It may have only stopped moving. If the urine remains completely obstructed, it can cause loss of kidney function or even complete destruction of the kidney. It is your responsibility to make sure X-rays and follow-ups are completed. Ultrasounds of the kidney can show blockages and the status of the kidney. Ultrasounds are not associated with any radiation and can be performed easily in a matter of minutes.  Make changes to your daily diet as told by your health care provider. You may be told to:  Limit the amount of salt that you eat.  Eat 5 or more servings of fruits and vegetables each day.  Limit the amount of meat, poultry, fish, and eggs that you eat.  Collect a 24-hour urine sample as told by your health care provider.You may need to collect another urine sample every 6-12 months. SEEK MEDICAL CARE IF:  You experience pain that is progressive and unresponsive to any pain medicine you have been prescribed. SEEK IMMEDIATE MEDICAL CARE IF:   Pain cannot be controlled with the prescribed medicine.  You have a fever or shaking chills.  The severity or intensity of pain increases over 18 hours and is not relieved by pain medicine.  You develop a new onset of abdominal pain.  You feel faint or pass out.  You are unable to urinate.   This information is not  intended to replace advice given to you by your health care provider. Make sure you discuss any questions you have with your health care provider.   Document Released: 12/14/2005 Document Revised: 09/04/2015 Document Reviewed: 05/17/2013 Elsevier Interactive Patient Education [employer]

## 2016-11-23 ENCOUNTER — Emergency Department: Payer: Self-pay

## 2016-11-23 ENCOUNTER — Emergency Department
Admission: EM | Admit: 2016-11-23 | Discharge: 2016-11-23 | Disposition: A | Discharge status: Home / Self Care | Payer: Self-pay | Attending: Student in an Organized Health Care Education/Training Program | Admitting: Student in an Organized Health Care Education/Training Program

## 2016-11-23 DIAGNOSIS — N201 Calculus of ureter: Secondary | ICD-10-CM | POA: Insufficient documentation

## 2016-11-23 DIAGNOSIS — Z79899 Other long term (current) drug therapy: Secondary | ICD-10-CM | POA: Insufficient documentation

## 2016-11-23 DIAGNOSIS — Z7982 Long term (current) use of aspirin: Secondary | ICD-10-CM | POA: Insufficient documentation

## 2016-11-23 DIAGNOSIS — Z791 Long term (current) use of non-steroidal anti-inflammatories (NSAID): Secondary | ICD-10-CM | POA: Insufficient documentation

## 2016-11-23 DIAGNOSIS — I10 Essential (primary) hypertension: Secondary | ICD-10-CM | POA: Insufficient documentation

## 2016-11-23 DIAGNOSIS — F17210 Nicotine dependence, cigarettes, uncomplicated: Secondary | ICD-10-CM | POA: Insufficient documentation

## 2016-11-23 DIAGNOSIS — K5732 Diverticulitis of large intestine without perforation or abscess without bleeding: Secondary | ICD-10-CM | POA: Insufficient documentation

## 2016-11-23 DIAGNOSIS — R109 Unspecified abdominal pain: Secondary | ICD-10-CM

## 2016-11-23 LAB — URINALYSIS COMPLETE WITH MICROSCOPIC (ARMC ONLY)
BILIRUBIN URINE: NEGATIVE
Bacteria, UA: NONE SEEN
GLUCOSE, UA: NEGATIVE mg/dL
Hgb urine dipstick: NEGATIVE
Ketones, ur: NEGATIVE mg/dL
Leukocytes, UA: NEGATIVE
Nitrite: NEGATIVE
Protein, ur: NEGATIVE mg/dL
SPECIFIC GRAVITY, URINE: 1.015 (ref 1.005–1.030)
pH: 6 (ref 5.0–8.0)

## 2016-11-23 LAB — CBC WITH DIFFERENTIAL/PLATELET
BASOS ABS: 0.1 10*3/uL (ref 0–0.1)
Basophils Relative: 1 %
EOS PCT: 3 %
Eosinophils Absolute: 0.6 10*3/uL (ref 0–0.7)
HEMATOCRIT: 45.9 % (ref 35.0–47.0)
Hemoglobin: 15.4 g/dL (ref 12.0–16.0)
LYMPHS ABS: 5.6 10*3/uL — AB (ref 1.0–3.6)
LYMPHS PCT: 24 %
MCH: 30.4 pg (ref 26.0–34.0)
MCHC: 33.6 g/dL (ref 32.0–36.0)
MCV: 90.5 fL (ref 80.0–100.0)
MONO ABS: 1.4 10*3/uL — AB (ref 0.2–0.9)
MONOS PCT: 6 %
NEUTROS ABS: 15.7 10*3/uL — AB (ref 1.4–6.5)
Neutrophils Relative %: 66 %
Platelets: 334 10*3/uL (ref 150–440)
RBC: 5.07 MIL/uL (ref 3.80–5.20)
RDW: 13.5 % (ref 11.5–14.5)
WBC: 23.5 10*3/uL — ABNORMAL HIGH (ref 3.6–11.0)

## 2016-11-23 LAB — BASIC METABOLIC PANEL
ANION GAP: 10 (ref 5–15)
BUN: 11 mg/dL (ref 6–20)
CALCIUM: 9.6 mg/dL (ref 8.9–10.3)
CO2: 23 mmol/L (ref 22–32)
Chloride: 105 mmol/L (ref 101–111)
Creatinine, Ser: 0.58 mg/dL (ref 0.44–1.00)
GFR calc Af Amer: >60 mL/min (ref >60)
GFR calc non Af Amer: >60 mL/min (ref >60)
GLUCOSE: 111 mg/dL — AB (ref 65–99)
Potassium: 3.6 mmol/L (ref 3.5–5.1)
Sodium: 138 mmol/L (ref 135–145)

## 2016-11-23 MED ORDER — ONDANSETRON HCL 4 MG/2ML IJ SOLN
4.0000 mg | Freq: Once | INTRAMUSCULAR | Status: AC
  Administered 2016-11-23: 4 mg via INTRAVENOUS
  Filled 2016-11-23: qty 2

## 2016-11-23 MED ORDER — PROMETHAZINE HCL 12.5 MG PO TABS: 12.5000 mg | ORAL_TABLET | Freq: Four times a day (QID) | ORAL | 0 refills | Status: AC | PRN

## 2016-11-23 MED ORDER — CIPROFLOXACIN IN D5W 400 MG/200ML IV SOLN
400.0000 mg | Freq: Once | INTRAVENOUS | Status: DC
  Filled 2016-11-23: qty 200

## 2016-11-23 MED ORDER — HYDROCODONE-ACETAMINOPHEN 5-325 MG PO TABS: 1.0000 | ORAL_TABLET | ORAL | 0 refills | Status: AC | PRN

## 2016-11-23 MED ORDER — SODIUM CHLORIDE 0.9 % IV BOLUS (SEPSIS)
1000.0000 mL | Freq: Once | INTRAVENOUS | Status: AC
  Administered 2016-11-23: 1000 mL via INTRAVENOUS

## 2016-11-23 MED ORDER — CIPROFLOXACIN HCL 500 MG PO TABS
500.0000 mg | ORAL_TABLET | Freq: Once | ORAL | Status: AC
  Administered 2016-11-23: 500 mg via ORAL
  Filled 2016-11-23: qty 1

## 2016-11-23 MED ORDER — CIPROFLOXACIN HCL 500 MG PO TABS: 500.0000 mg | ORAL_TABLET | Freq: Two times a day (BID) | ORAL | 0 refills | Status: AC

## 2016-11-23 MED ORDER — METRONIDAZOLE 500 MG PO TABS: 500.0000 mg | ORAL_TABLET | Freq: Three times a day (TID) | ORAL | 0 refills | Status: AC

## 2016-11-23 MED ORDER — IOPAMIDOL (ISOVUE-300) INJECTION 61%
100.0000 mL | Freq: Once | INTRAVENOUS | Status: AC | PRN
  Administered 2016-11-23: 100 mL via INTRAVENOUS

## 2016-11-23 MED ORDER — FENTANYL CITRATE (PF) 100 MCG/2ML IJ SOLN
50.0000 ug | INTRAMUSCULAR | Status: DC | PRN
  Administered 2016-11-23: 50 ug via INTRAVENOUS
  Filled 2016-11-23: qty 2

## 2016-11-23 MED ORDER — METRONIDAZOLE 500 MG PO TABS
500.0000 mg | ORAL_TABLET | Freq: Once | ORAL | Status: AC
  Administered 2016-11-23: 500 mg via ORAL
  Filled 2016-11-23: qty 1

## 2016-11-23 MED ORDER — HYDROCODONE-ACETAMINOPHEN 5-325 MG PO TABS
1.0000 | ORAL_TABLET | Freq: Once | ORAL | Status: AC
  Administered 2016-11-23: 1 via ORAL
  Filled 2016-11-23: qty 1

## 2016-11-23 NOTE — ED Provider Notes (Signed)
[facility] Emergency Department Provider Note    First MD Initiated Contact with Patient 11/23/16 1816     (approximate)  I have reviewed the triage vital signs and the nursing notes.   HISTORY  Chief Complaint Flank Pain    HPI Carolyn Murray is a 50 y.o. female with a history of kidney stones presents with 1 day of acute left upper quadrant and left flank pain. Patient states the pain is currently 8 out of 10 in severity. Is not improved with movement. States the pain is constant. Patient states that she had to leave work early due to the pain and inability to work. She denies any heavy lifting. No recent trauma. No dysuria.  States she is feeling constipated. No diarrhea. His feeling "queasy" but no vomiting. No measured fevers.   Past Medical History:  Diagnosis Date  . GERD (gastroesophageal reflux disease)   . Hypertension   . Nipple discharge 07/12/2013  . Stroke [facility](HCC)    Family History  Problem Relation Age of Onset  . Skin cancer Father   . Diabetes Mother     pass in 2013  . Breast cancer Maternal Grandmother    Past Surgical History:  Procedure Laterality Date  . CHOLECYSTECTOMY    . TUBAL LIGATION     Patient Active Problem List   Diagnosis Date Noted  . Nipple discharge 07/12/2013      Prior to Admission medications   Medication Sig Start Date End Date Taking? Authorizing Provider  Aspirin-Salicylamide-Caffeine (BC HEADACHE POWDER PO) Take 1 Package by mouth every 6 (six) hours as needed.    Historical Provider, MD  Fish Oil-Cholecalciferol (FISH OIL + D3 PO) Take 1 capsule by mouth daily.    Historical Provider, MD  ibuprofen (ADVIL,MOTRIN) 200 MG tablet Take 200 mg by mouth every 6 (six) hours as needed for pain.    Historical Provider, MD  Multiple Vitamins-Minerals (MULTIVITAMIN WITH MINERALS) tablet Take 1 tablet by mouth daily.    Historical Provider, MD  ondansetron (ZOFRAN ODT) 4 MG disintegrating tablet Take 1 tablet  (4 mg total) by mouth every 8 (eight) hours as needed for nausea or vomiting. 06/05/16   Rebecka ApleyAllison P Webster, MD  oxyCODONE-acetaminophen (ROXICET) 5-325 MG tablet Take 1 tablet by mouth every 6 (six) hours as needed. 06/05/16   Rebecka ApleyAllison P Webster, MD  tamsulosin (FLOMAX) 0.4 MG CAPS capsule Take 1 capsule (0.4 mg total) by mouth daily. 06/05/16   Rebecka ApleyAllison P Webster, MD    Allergies Patient has no known allergies.    Social History Social History  Substance Use Topics  . Smoking status: Current Every Day Smoker    Packs/day: 0.50    Years: 20.00    Types: Cigarettes  . Smokeless tobacco: Never Used  . Alcohol use No    Review of Systems Patient denies headaches, rhinorrhea, blurry vision, numbness, shortness of breath, chest pain, edema, cough, abdominal pain, nausea, vomiting, diarrhea, dysuria, fevers, rashes or hallucinations unless otherwise stated above in HPI. ____________________________________________   PHYSICAL EXAM:  VITAL SIGNS: Vitals:   11/23/16 1840 11/23/16 2122  BP: 135/67 (!) 119/59  Pulse: (!) 113 97  Resp: 18 18  Temp:      Constitutional: Alert and oriented.  in no acute distress. Eyes: Conjunctivae are normal. PERRL. EOMI. Head: Atraumatic. Nose: No congestion/rhinnorhea. Mouth/Throat: Mucous membranes are moist.  Oropharynx non-erythematous. Neck: No stridor. Painless ROM. No cervical spine tenderness to palpation Hematological/Lymphatic/Immunilogical: No cervical lymphadenopathy. Cardiovascular: mildly  tachycardic, regular rhythm. Grossly normal heart sounds.  Good peripheral circulation. Respiratory: Normal respiratory effort.  No retractions. Lungs CTAB. Gastrointestinal: Soft and nontender. No distention. No abdominal bruits. ttp of left flank and luq.  No rebound or guarding.  Musculoskeletal: No lower extremity tenderness nor edema.  No joint effusions. Neurologic:  Normal speech and language. No gross focal neurologic deficits are appreciated. No  gait instability. Skin:  Skin is warm, dry and intact. No rash noted. Psychiatric: Mood and affect are normal. Speech and behavior are normal.  ____________________________________________   LABS (all labs ordered are listed, but only abnormal results are displayed)  Results for orders placed or performed during the hospital encounter of 11/23/16 (from the past 24 hour(s))  Urinalysis complete, with microscopic- may I&O cath if menses     Status: Abnormal   Collection Time: 11/23/16  4:11 PM  Result Value Ref Range   Color, Urine YELLOW (A) YELLOW   APPearance CLEAR (A) CLEAR   Glucose, UA NEGATIVE NEGATIVE mg/dL   Bilirubin Urine NEGATIVE NEGATIVE   Ketones, ur NEGATIVE NEGATIVE mg/dL   Specific Gravity, Urine 1.015 1.005 - 1.030   Hgb urine dipstick NEGATIVE NEGATIVE   pH 6.0 5.0 - 8.0   Protein, ur NEGATIVE NEGATIVE mg/dL   Nitrite NEGATIVE NEGATIVE   Leukocytes, UA NEGATIVE NEGATIVE   RBC / HPF 0-5 0 - 5 RBC/hpf   WBC, UA 0-5 0 - 5 WBC/hpf   Bacteria, UA NONE SEEN NONE SEEN   Squamous Epithelial / LPF 0-5 (A) NONE SEEN   ____________________________________________  EKG  ____________________________________________  RADIOLOGY  I personally reviewed all radiographic images ordered to evaluate for the above acute complaints and reviewed radiology reports and findings.  These findings were personally discussed with the patient.  Please see medical record for radiology report.  ____________________________________________   PROCEDURES  Procedure(s) performed: none Procedures    Critical Care performed: no ____________________________________________   INITIAL IMPRESSION / ASSESSMENT AND PLAN / ED COURSE  Pertinent labs & imaging results that were available during my care of the patient were reviewed by me and considered in my medical decision making (see chart for details).  DDX: diverticultisi, pyelo, stone, colitis, msk strain  Carolyn Murray is a 50 y.o.  who presents to the ED with left flank and left upper quadrant pain. Patient arrives afebrile with mild tachycardia but otherwise normotensive. She does not appear to be in any acute ST distress. Based on her tenderness on exam will order laboratory evaluation as well as CT imaging to evaluate for any evidence of acute diverticulitis, abscess or complication. Her urinalysis shows no evidence of infection or blood to suggest nephrolithiasis. Patient will receive IV fluids for her tachycardia and IV pain medication.  The patient will be placed on continuous pulse oximetry and telemetry for monitoring.  Laboratory evaluation will be sent to evaluate for the above complaints.     Clinical Course as of Nov 24 2131  Mon Nov 23, 2016  2025 CT imaging with evidence of acute diverticulitis without any evidence of abscess or perforation. Patient states pain is improved. Still mildly tachycardic but IV fluids still infusing. We will give dose of IV antibiotics continue symptomatically management. We will trial of by mouth challenge. If hemodynamic stable and remains well appearing in no acute distress to feel patient will be appropriate for outpatient management. She is unable tolerate oral antibiotics will discuss with hospitalist for admission.  [PR]  2129 Patient requesting discharge home. I  recommended patient stay for IV antibiotics the patient states that she does not want to stay and demonstrates good understanding of the risks of leaving without receiving IV antibiotics.. States that her pain is improved. Her heart rate has also improved. Her repeat abdominal exam is reassuring. Patient has tolerated oral antibiotics and oral pain medication.  Prescribed course of antibiotics to treat her acute diverticulitis assisted well as antinausea medications and pain medications. I encouraged patient to return should her symptoms worsen.  Patient was able to tolerate PO and was able to ambulate with a steady gait.  Have  discussed with the patient and available family all diagnostics and treatments performed thus far and all questions were answered to the best of my ability. The patient demonstrates understanding and agreement with plan.    [PR]    Clinical Course User Index [PR] Willy Eddy, MD     ____________________________________________   FINAL CLINICAL IMPRESSION(S) / ED DIAGNOSES  Final diagnoses:  Diverticulitis of large intestine without perforation or abscess without bleeding  Ureterolithiasis  Left flank pain      NEW MEDICATIONS STARTED DURING THIS VISIT:  New Prescriptions   No medications on file     Note:  This document was prepared using Dragon voice recognition software and may include unintentional dictation errors.    Willy Eddy, MD 11/23/16 2132

## 2016-11-23 NOTE — ED Notes (Signed)
Patient states her daughter will be here soon to pick her up and is her only ride home. Dr. Roxan Hockeyobinson aware.

## 2016-11-23 NOTE — ED Triage Notes (Signed)
Pt c/o left flank pain since last night with nausea.. Denies painful urination .Marland Kitchen. Last BM yesterday, normal

## 2019-01-10 ENCOUNTER — Encounter: Payer: Self-pay | Admitting: Emergency Medicine

## 2019-01-10 ENCOUNTER — Other Ambulatory Visit: Payer: Self-pay

## 2019-01-10 ENCOUNTER — Emergency Department
Admission: EM | Admit: 2019-01-10 | Discharge: 2019-01-10 | Disposition: A | Discharge status: Home / Self Care | Payer: Self-pay | Attending: Emergency Medicine | Admitting: Emergency Medicine

## 2019-01-10 ENCOUNTER — Emergency Department: Payer: Self-pay

## 2019-01-10 DIAGNOSIS — N200 Calculus of kidney: Secondary | ICD-10-CM

## 2019-01-10 DIAGNOSIS — N23 Unspecified renal colic: Secondary | ICD-10-CM | POA: Insufficient documentation

## 2019-01-10 DIAGNOSIS — I10 Essential (primary) hypertension: Secondary | ICD-10-CM | POA: Insufficient documentation

## 2019-01-10 DIAGNOSIS — F17210 Nicotine dependence, cigarettes, uncomplicated: Secondary | ICD-10-CM | POA: Insufficient documentation

## 2019-01-10 LAB — CBC WITH DIFFERENTIAL/PLATELET
Abs Immature Granulocytes: 0.14 10*3/uL — ABNORMAL HIGH (ref 0.00–0.07)
Abs Immature Granulocytes: 0.13 10*3/uL — ABNORMAL HIGH (ref 0.00–0.07)
BASOS PCT: 1 %
Basophils Absolute: 0.1 10*3/uL (ref 0.0–0.1)
Basophils Absolute: 0.1 10*3/uL (ref 0.0–0.1)
Basophils Relative: 0 %
Eosinophils Absolute: 0.2 10*3/uL (ref 0.0–0.5)
Eosinophils Absolute: 0 10*3/uL (ref 0.0–0.5)
Eosinophils Relative: 1 %
Eosinophils Relative: 0 %
HCT: 48.4 % — ABNORMAL HIGH (ref 36.0–46.0)
HCT: 46.1 % — ABNORMAL HIGH (ref 36.0–46.0)
Hemoglobin: 16.3 g/dL — ABNORMAL HIGH (ref 12.0–15.0)
Hemoglobin: 15.4 g/dL — ABNORMAL HIGH (ref 12.0–15.0)
Immature Granulocytes: 1 %
Immature Granulocytes: 1 %
Lymphocytes Relative: 16 %
Lymphocytes Relative: 9 %
Lymphs Abs: 3.4 10*3/uL (ref 0.7–4.0)
Lymphs Abs: 2.1 10*3/uL (ref 0.7–4.0)
MCH: 30.5 pg (ref 26.0–34.0)
MCH: 30.6 pg (ref 26.0–34.0)
MCHC: 33.7 g/dL (ref 30.0–36.0)
MCHC: 33.4 g/dL (ref 30.0–36.0)
MCV: 90.6 fL (ref 80.0–100.0)
MCV: 91.5 fL (ref 80.0–100.0)
MONO ABS: 0.9 10*3/uL (ref 0.1–1.0)
MONOS PCT: 4 %
Monocytes Absolute: 0.9 10*3/uL (ref 0.1–1.0)
Monocytes Relative: 4 %
Neutro Abs: 17.2 10*3/uL — ABNORMAL HIGH (ref 1.7–7.7)
Neutro Abs: 20.1 10*3/uL — ABNORMAL HIGH (ref 1.7–7.7)
Neutrophils Relative %: 77 %
Neutrophils Relative %: 86 %
Platelets: 372 10*3/uL (ref 150–400)
Platelets: 387 10*3/uL (ref 150–400)
RBC: 5.34 MIL/uL — ABNORMAL HIGH (ref 3.87–5.11)
RBC: 5.04 MIL/uL (ref 3.87–5.11)
RDW: 12.7 % (ref 11.5–15.5)
RDW: 12.7 % (ref 11.5–15.5)
WBC: 22 10*3/uL — ABNORMAL HIGH (ref 4.0–10.5)
WBC: 23.4 10*3/uL — ABNORMAL HIGH (ref 4.0–10.5)
nRBC: 0 % (ref 0.0–0.2)
nRBC: 0 % (ref 0.0–0.2)

## 2019-01-10 LAB — URINALYSIS, COMPLETE (UACMP) WITH MICROSCOPIC
Bacteria, UA: NONE SEEN
Bilirubin Urine: NEGATIVE
Glucose, UA: NEGATIVE mg/dL
Ketones, ur: 5 mg/dL — AB
Leukocytes, UA: NEGATIVE
NITRITE: NEGATIVE
PH: 5 (ref 5.0–8.0)
Protein, ur: 30 mg/dL — AB
RBC / HPF: >50 RBC/hpf — ABNORMAL HIGH (ref 0–5)
Specific Gravity, Urine: 1.027 (ref 1.005–1.030)
WBC, UA: NONE SEEN WBC/hpf (ref 0–5)

## 2019-01-10 LAB — COMPREHENSIVE METABOLIC PANEL
ALT: 18 U/L (ref 0–44)
AST: 23 U/L (ref 15–41)
Albumin: 4 g/dL (ref 3.5–5.0)
Alkaline Phosphatase: 122 U/L (ref 38–126)
Anion gap: 12 (ref 5–15)
BUN: 23 mg/dL — ABNORMAL HIGH (ref 6–20)
CHLORIDE: 106 mmol/L (ref 98–111)
CO2: 19 mmol/L — ABNORMAL LOW (ref 22–32)
Calcium: 9.3 mg/dL (ref 8.9–10.3)
Creatinine, Ser: 0.76 mg/dL (ref 0.44–1.00)
GFR calc Af Amer: >60 mL/min (ref >60)
GFR calc non Af Amer: >60 mL/min (ref >60)
Glucose, Bld: 158 mg/dL — ABNORMAL HIGH (ref 70–99)
Potassium: 3.9 mmol/L (ref 3.5–5.1)
Sodium: 137 mmol/L (ref 135–145)
Total Bilirubin: 0.4 mg/dL (ref 0.3–1.2)
Total Protein: 7.8 g/dL (ref 6.5–8.1)

## 2019-01-10 LAB — LIPASE, BLOOD: Lipase: 38 U/L (ref 11–51)

## 2019-01-10 MED ORDER — OXYCODONE-ACETAMINOPHEN 7.5-325 MG PO TABS: 1.0000 | ORAL_TABLET | ORAL | 0 refills | Status: AC | PRN

## 2019-01-10 MED ORDER — HYDROMORPHONE HCL 1 MG/ML IJ SOLN
0.5000 mg | Freq: Once | INTRAMUSCULAR | Status: AC
  Administered 2019-01-10: 0.5 mg via INTRAVENOUS
  Filled 2019-01-10: qty 1

## 2019-01-10 MED ORDER — KETOROLAC TROMETHAMINE 30 MG/ML IJ SOLN
30.0000 mg | Freq: Once | INTRAMUSCULAR | Status: AC
  Administered 2019-01-10: 30 mg via INTRAVENOUS
  Filled 2019-01-10: qty 1

## 2019-01-10 MED ORDER — TAMSULOSIN HCL 0.4 MG PO CAPS: 0.4000 mg | ORAL_CAPSULE | Freq: Every day | ORAL | 0 refills | Status: AC

## 2019-01-10 MED ORDER — CEPHALEXIN 250 MG PO CAPS: 250.0000 mg | ORAL_CAPSULE | Freq: Four times a day (QID) | ORAL | 0 refills | Status: AC

## 2019-01-10 MED ORDER — SODIUM CHLORIDE 0.9 % IV SOLN
1.0000 g | Freq: Once | INTRAVENOUS | Status: AC
  Administered 2019-01-10: 1 g via INTRAVENOUS
  Filled 2019-01-10: qty 10

## 2019-01-10 MED ORDER — CEPHALEXIN 250 MG PO CAPS: 250.0000 mg | ORAL_CAPSULE | Freq: Four times a day (QID) | ORAL | 0 refills | Status: DC

## 2019-01-10 NOTE — ED Provider Notes (Signed)
[facility] Emergency Department Provider Note       Time seen: ----------------------------------------- 8:39 AM on 01/10/2019 -----------------------------------------   I have reviewed the triage vital signs and the nursing notes.  HISTORY   Chief Complaint Flank Pain    HPI Carolyn Murray is a 53 y.o. female with a history of GERD, hypertension, CVA who presents to the ED for sudden onset right flank pain.  Patient arrives from home by EMS with right flank pain that radiates into the right lower quadrant.  She has never had this before, it is a sharp stabbing pain.  Past Medical History:  Diagnosis Date  . GERD (gastroesophageal reflux disease)   . Hypertension   . Nipple discharge 07/12/2013  . Stroke [facility])     Patient Active Problem List   Diagnosis Date Noted  . Nipple discharge 07/12/2013    Past Surgical History:  Procedure Laterality Date  . CHOLECYSTECTOMY    . TUBAL LIGATION      Allergies Patient has no known allergies.  Social History Social History   Tobacco Use  . Smoking status: Current Every Day Smoker    Packs/day: 0.50    Years: 20.00    Pack years: 10.00    Types: Cigarettes  . Smokeless tobacco: Never Used  Substance Use Topics  . Alcohol use: No  . Drug use: No   Review of Systems Constitutional: Negative for fever. Cardiovascular: Negative for chest pain. Respiratory: Negative for shortness of breath. Gastrointestinal: Positive for right flank pain Musculoskeletal: Negative for back pain. Skin: Negative for rash. Neurological: Negative for headaches, focal weakness or numbness.  All systems negative/normal/unremarkable except as stated in the HPI  ____________________________________________   PHYSICAL EXAM:  VITAL SIGNS: ED Triage Vitals  Enc Vitals Group     BP 01/10/19 0837 (!) 186/88     Pulse Rate 01/10/19 0837 93     Resp 01/10/19 0837 18     Temp 01/10/19 0837 97.6 F (36.4 C)   Temp src --      SpO2 01/10/19 0837 97 %     Weight 01/10/19 0838 204 lb (92.5 kg)     Height 01/10/19 0838 5\' 3"  (1.6 m)     Head Circumference --      Peak Flow --      Pain Score 01/10/19 0838 8     Pain Loc --      Pain Edu? --      Excl. in GC? --    Constitutional: Alert and oriented.  Mild distress from pain Eyes: Conjunctivae are normal. Normal extraocular movements.      Head: Normocephalic and atraumatic.      Nose: No congestion/rhinnorhea.      Mouth/Throat: Mucous membranes are moist.      Neck: No stridor. Cardiovascular: Normal rate, regular rhythm. No murmurs, rubs, or gallops. Respiratory: Normal respiratory effort without tachypnea nor retractions. Breath sounds are clear and equal bilaterally. No wheezes/rales/rhonchi. Gastrointestinal: Right flank tenderness, no rebound or guarding.  Normal bowel sounds. Musculoskeletal: Nontender with normal range of motion in extremities. No lower extremity tenderness nor edema. Neurologic:  Normal speech and language. No gross focal neurologic deficits are appreciated.  Skin:  Skin is warm, dry and intact. No rash noted. Psychiatric: Mood and affect are normal. Speech and behavior are normal.  ____________________________________________  ED COURSE:  As part of my medical decision making, I reviewed the following data within the electronic MEDICAL RECORD NUMBER History obtained from  family if available, nursing notes, old chart and ekg, as well as notes from prior ED visits. Patient presented for flank pain, we will assess with labs and imaging as indicated at this time. Clinical Course as of Jan 10 1107  Tue Jan 10, 2019  0263 Skin was evaluated by Dr. Tonna Boehringer.  This is unlikely to be acute appendicitis.   [JW]    Clinical Course User Index [JW] Emily Filbert, MD   Procedures ____________________________________________   LABS (pertinent positives/negatives)  Labs Reviewed  CBC WITH DIFFERENTIAL/PLATELET - Abnormal;  Notable for the following components:      Result Value   WBC 22.0 (*)    RBC 5.34 (*)    Hemoglobin 16.3 (*)    HCT 48.4 (*)    Neutro Abs 17.2 (*)    Abs Immature Granulocytes 0.14 (*)    All other components within normal limits  COMPREHENSIVE METABOLIC PANEL - Abnormal; Notable for the following components:   CO2 19 (*)    Glucose, Bld 158 (*)    BUN 23 (*)    All other components within normal limits  URINALYSIS, COMPLETE (UACMP) WITH MICROSCOPIC - Abnormal; Notable for the following components:   Color, Urine AMBER (*)    APPearance CLOUDY (*)    Hgb urine dipstick LARGE (*)    Ketones, ur 5 (*)    Protein, ur 30 (*)    RBC / HPF >50 (*)    All other components within normal limits  CBC WITH DIFFERENTIAL/PLATELET - Abnormal; Notable for the following components:   WBC 23.4 (*)    Hemoglobin 15.4 (*)    HCT 46.1 (*)    Neutro Abs 20.1 (*)    Abs Immature Granulocytes 0.13 (*)    All other components within normal limits  LIPASE, BLOOD    RADIOLOGY Images were viewed by me  CT renal protocol IMPRESSION: 1. 3 mm stone obstructing the proximal right ureter creating prominent right hydronephrosis. 2. Slightly prominent appendix with slight haziness around the appendix in the right lower quadrant. Is the patient tender at that site? 3. Large soft disc protrusion at L4-5 with grade 1 spondylolisthesis. These findings create almost complete obliteration of the thecal sac. 4. Extensive diverticulosis of the colon without diverticulitis. 5. Extensive aortic atherosclerosis. ____________________________________________   DIFFERENTIAL DIAGNOSIS   Renal colic, UTI, pyelonephritis, muscle strain, shingles  FINAL ASSESSMENT AND PLAN  Renal colic   Plan: The patient had presented for sudden onset right flank pain. Patient's labs did reveal leukocytosis and hematuria.  Initially I was planning on obtaining a repeat CBC but did not realize that she has a chronically  elevated white blood cell count levels. Patient's imaging revealed a proximal right ureteral stone.  She did have an abnormal appearing appendix which is chronically abnormally appearing.  CT was reviewed by surgery.  This is unlikely to be felt related to her current situation.  She was given a gram of Rocephin.  I will advise close outpatient follow-up with urology with pain medicine and antibiotics.   Ulice Dash, MD    Note: This note was generated in part or whole with voice recognition software. Voice recognition is usually quite accurate but there are transcription errors that can and very often do occur. I apologize for any typographical errors that were not detected and corrected.     Emily Filbert, MD 01/10/19 1109

## 2019-01-10 NOTE — ED Triage Notes (Signed)
Pt to ER via EMS from home with c/o right flank pain, NOS.

## 2019-01-24 ENCOUNTER — Ambulatory Visit: Payer: Self-pay | Admitting: Urology

## 2019-01-24 ENCOUNTER — Encounter: Payer: Self-pay | Admitting: Urology
